# Patient Record
Sex: Female | Born: 1985 | Hispanic: Yes | Marital: Married | Smoking: Never smoker
Health system: Southern US, Community
[De-identification: ages and names within clinical notes are randomized; demographics above are authoritative.]

## PROBLEM LIST (undated history)

## (undated) DIAGNOSIS — K8020 Calculus of gallbladder without cholecystitis without obstruction: Secondary | ICD-10-CM

## (undated) HISTORY — PX: APPENDECTOMY: SHX54

---

## 2016-04-26 ENCOUNTER — Emergency Department
Admission: EM | Admit: 2016-04-26 | Discharge: 2016-04-26 | Disposition: A | Discharge status: Home / Self Care | Payer: Self-pay | Attending: Emergency Medicine | Admitting: Emergency Medicine

## 2016-04-26 DIAGNOSIS — B349 Viral infection, unspecified: Secondary | ICD-10-CM

## 2016-04-26 DIAGNOSIS — R0981 Nasal congestion: Secondary | ICD-10-CM

## 2016-04-26 DIAGNOSIS — R1010 Upper abdominal pain, unspecified: Secondary | ICD-10-CM | POA: Insufficient documentation

## 2016-04-26 LAB — URINALYSIS COMPLETE WITH MICROSCOPIC (ARMC ONLY)
BILIRUBIN URINE: NEGATIVE
Bacteria, UA: NONE SEEN
Glucose, UA: NEGATIVE mg/dL
KETONES UR: NEGATIVE mg/dL
LEUKOCYTES UA: NEGATIVE
Nitrite: NEGATIVE
PH: 6 (ref 5.0–8.0)
Protein, ur: NEGATIVE mg/dL
SPECIFIC GRAVITY, URINE: 1.004 — AB (ref 1.005–1.030)

## 2016-04-26 LAB — GLUCOSE, CAPILLARY: Glucose-Capillary: 80 mg/dL (ref 65–99)

## 2016-04-26 LAB — POCT PREGNANCY, URINE: Preg Test, Ur: NEGATIVE

## 2016-04-26 LAB — CBC
HCT: 39.1 % (ref 35.0–47.0)
Hemoglobin: 13.3 g/dL (ref 12.0–16.0)
MCH: 30.7 pg (ref 26.0–34.0)
MCHC: 34.1 g/dL (ref 32.0–36.0)
MCV: 89.9 fL (ref 80.0–100.0)
PLATELETS: 176 10*3/uL (ref 150–440)
RBC: 4.35 MIL/uL (ref 3.80–5.20)
RDW: 13.8 % (ref 11.5–14.5)
WBC: 7.5 10*3/uL (ref 3.6–11.0)

## 2016-04-26 LAB — BASIC METABOLIC PANEL
Anion gap: 6 (ref 5–15)
BUN: 12 mg/dL (ref 6–20)
CALCIUM: 9.2 mg/dL (ref 8.9–10.3)
CHLORIDE: 105 mmol/L (ref 101–111)
CO2: 28 mmol/L (ref 22–32)
CREATININE: 0.72 mg/dL (ref 0.44–1.00)
GFR calc non Af Amer: >60 mL/min (ref >60)
Glucose, Bld: 106 mg/dL — ABNORMAL HIGH (ref 65–99)
Potassium: 3.9 mmol/L (ref 3.5–5.1)
SODIUM: 139 mmol/L (ref 135–145)

## 2016-04-26 MED ORDER — FEXOFENADINE-PSEUDOEPHED ER 60-120 MG PO TB12: 1.0000 | ORAL_TABLET | Freq: Two times a day (BID) | ORAL | Status: DC

## 2016-04-26 MED ORDER — KETOROLAC TROMETHAMINE 60 MG/2ML IM SOLN
60.0000 mg | Freq: Once | INTRAMUSCULAR | Status: AC
  Administered 2016-04-26: 60 mg via INTRAMUSCULAR

## 2016-04-26 MED ORDER — NAPROXEN 500 MG PO TABS: 500.0000 mg | ORAL_TABLET | Freq: Two times a day (BID) | ORAL | Status: DC

## 2016-04-26 MED ORDER — KETOROLAC TROMETHAMINE 60 MG/2ML IM SOLN
INTRAMUSCULAR | Status: AC
  Filled 2016-04-26: qty 2

## 2016-04-26 NOTE — ED Notes (Signed)
Pt c/o of headache beginning last night. Pt c/o dizziness, and "body shakes" beginning this morning. Pt c/o congestion in morning. Pt denies N/V/D. Pt c/o upper medial abdominal pain. Pt reports being given medication for gastritis last week in clinic.

## 2016-04-26 NOTE — ED Notes (Signed)
Used Surveyor, mineralstranslator Rafael to assess patient

## 2016-04-26 NOTE — ED Notes (Signed)
[facility], pt reports started last night with weakness, dizziness, and a headache. Pt states she feels like she has a fever inside but has not taken her temp at home. Pt states she has also felt like she has a cold. Pt reports when she stands she feels like she is going to fall down. Pt also reports pressure across her face and forehead in her sinus area. Pt talking in full and complete sentences with no difficulty at this time.

## 2016-04-26 NOTE — Discharge Instructions (Signed)
Control de infecciones en el hogar (Infection Control in the Home) Si tiene una infeccin o est cuidando a alguien que tiene una infeccin, es importante saber cmo evitar que la infeccin se propague. Siga estas pautas para evitar la propagacin de una infeccin y hable con el mdico. CMO SE PROPAGAN LAS INFECCIONES? Para que una infeccin se propague, debe existir lo siguiente:  Un germen. Esto puede ser un virus, una bacteria, un hongo o un parsito.  Un lugar donde viva el germen. Esto puede ser lo siguiente:  En una persona, animal, planta o alimento.  En el suelo o en el agua.  En superficies como la manija de [place].  Un anfitrin vulnerable. Se trata de una persona o animal que no tiene resistencia (inmunidad) al germen.  Una manera de que el germen ingrese al anfitrin. Esto puede ocurrir de las siguientes maneras:  Chemical engineer. Esto ocurre al hacer contacto (por ejemplo, dar la mano o abrazar) con Neomia Dear persona o animal infectados. Algunos grmenes tambin pueden viajar a travs del aire y contagiarlo si una persona infectada tose o estornuda sobre usted o cerca de usted.  Contacto indirecto. Esto es cuando el germen ingresa en el anfitrin a travs del contacto con un objeto infectado. Los ejemplos incluyen comer alimentos contaminados, beber agua contaminada o tocar una superficie contaminada con las manos e inmediatamente despus tocarse la cara, la nariz o la boca. CMO PUEDO PREVENIR LA PROPAGACIN DE LA INFECCIN? Hay varias cosas que puede hacer para prevenir la propagacin de la infeccin. Lavado de manos Es muy importante lavarse las manos correctamente, siguiendo estos pasos: 1. Mjese las manos con agua corriente limpia. 2. Aplquese jabn en las manos. El jabn lquido es mejor que el jabn en barra. 3. Lenoria Farrier las manos rpidamente para hacer espuma. 4. Hgalo durante al menos 20 segundos. Refriegue bien todas las partes de [address], incluido debajo de  las uas y [employer] dedos. 5. Enjuguese las manos con agua corriente limpia hasta que no [place]. 6. Squese las manos con un secador de aire o con una toalla limpia de papel o tela, o deje que se sequen al aire. No use su ropa o una toalla sucia para secarse. 7. Si est en un bao pblico, use su propia toalla para cerrar el grifo y abrir la puerta del bao. Asegrese de lavarse las manos:  Antes de:  Visitar a un beb o a alguien con un sistema de defensa (inmunitario) debilitado o deprimido.  Ponerse y [employer] de [place].  Despus de:  Printmaker y jugar al [employer].  Tocar un animal, sus juguetes o su correa.  Manipular ganado.  Usar el bao o ayudar a un nio o a un adulto a Biomedical engineer.  Usar productos de limpieza del hogar o sustancias qumicas txicas.  Tocar o sacar la basura.  Tocar cualquier objeto sucio en su casa.  Manipular ropa o trapos sucios.  Cuidar a un nio enfermo. Esto incluye tocar pauelos usados, juguetes y ropa.  Estornudar, toser o Agricultural consultant.  Viajar en transporte pblico.  Dar la mano.  Usar un telfono, incluido su telfono mvil.  Tocar dinero.  Antes y despus de:  Preparar comida.  Preparar el bibern de un beb.  Alimentar a un beb o a un nio pequeo.  Comer.  Visitar o cuidar a alguien enfermo.  Cambiar un paal.  Cambiar un vendaje, o cuidar de una lesin o herida.  Administrar o  tomar medicamentos. Cuidado del hogar  Asegrese de tener siempre productos de limpieza. Estos incluyen los siguientes:  Desinfectantes.  Paos de limpieza reutilizables. Lvelos despus de cada uso.  Toallas de papel.  Guantes de [place]an[place]rabajo. Reemplace los guantes si estn rotos o rasgados o si se empiezan a gastar.  Use productos que contenga lavandina de forma segura. Nunca los Longs Drug Storesmezcle con otros productos de limpieza, en especial, si contienen amonaco. Esta mezcla puede producir un gas peligroso que puede ser  mortal.  Cuide los productos de limpieza. Las escobillas de inodoro, trapeadores y Intel Corporationesponjas pueden cultivar grmenes. Sumrjalos en agua y leja durante cinco minutos despus de cada uso.  No vierta el agua usada del trapeador en el fregadero. En lugar de eso, trela en el inodoro.  Mantenga una ventilacin Estate agentadecuada en el hogar.  Si tiene Dynegyuna mascota, asegrese de Carvermantenerla limpia. No permita que personas con un sistema inmunitario dbil toquen excremento de pjaros, el agua de la pecera ni bandejas sanitarias.  Si tiene Financial controllerun gato, asegrese de Administratorlimpiar la bandeja sanitaria todos Bellefontelos das.  En el bao, asegrese de lo siguiente:  Reponer el jabn lquido.  Cambiar las toallas y las toallitas de mano con frecuencia. Evitar compartir toallas y [address] Finaltoallitas de [place].  Cambiar los cepillos de dientes a menudo y guardarlos por separado en un lugar limpio y seco.  Desinfectar el inodoro.  Limpiar la baera, la ducha y el fregadero con productos de Art gallery managerlimpieza estndar.  Trapear el piso con un producto de limpieza estndar.  No compartir elementos personales, como afeitadoras, cepillos de dientes, vasos, desodorantes, peines, cepillos, toallas y [address]/Antonio G Mellado Finaltoallitas de [address]mano.  En la cocina, asegrese de lo siguiente:  Academic librarianAlmacenar los Constellation Brandsalimentos cuidadosamente.  Refrigerar las sobras de inmediato en recipientes con tapa.  Tirar alimentos rancios o en mal estado.  Limpiar el interior del refrigerador todas las semanas.  Mantener el refrigerador en 35F (4C) o menos y el congelador a una temperatura de 62F (-18C) o menos.  Descongelar los Quest Diagnosticsalimentos en el refrigerador o el microondas, no a Publishing rights managertemperatura ambiente.  Servir los alimentos a la Optician, dispensingtemperatura adecuada. No comer carne cruda. Asegrese de Constellation Energycocinar la carne a la temperatura adecuada. Cocinar los News Corporationhuevos hasta que estn firmes.  Lavar las frutas y las verduras debajo del agua corriente.  Usar tablas de corte, platos y utensilios distintos para  alimentos crudos y alimentos cocidos.  Mantener las superficies de trabajo limpias.  Usar una cuchara limpia cada vez que prueba la comida mientras cocina.  Lavar la vajilla con agua caliente y [place]. Secar la vajilla al aire o usar un lavavajillas.  No compartir tenedores, tazas o cucharas durante las comidas.  Usar guantes si la ropa est visiblemente sucia.  Cambiar la ropa blanca todas las semanas o cuando est sucia.  No sacudir la ropa blanca sucia. Hacerlo puede esparcir los grmenes por el aire. Colocar apsitos, toallitas femeninas o para la incontinencia, paales y guantes en bolsas de basura plsticas para su eliminacin.   Esta informacin no tiene Theme park managercomo fin reemplazar el consejo del mdico. Asegrese de hacerle al mdico cualquier pregunta que tenga.   Document Released: 10/23/2008 Document Revised: 12/01/2014 Elsevier Interactive Patient Education [employer]2016 Elsevier Inc.

## 2016-04-26 NOTE — ED Notes (Signed)
Used Surveyor, mineralstranslator Rafael to discharge pt. Reviewed d/c instructions, prescriptions, and follow-up care pt. Pt verbalized understanding.

## 2016-04-26 NOTE — ED Provider Notes (Signed)
[facility] Emergency Department Provider Note   ____________________________________________  Time seen: Approximately 8:49 PM  I have reviewed the triage vital signs and the nursing notes.   HISTORY Via interpreter Chief Complaint Dizziness; Headache; Weakness; and URI    HPI Gabriela Lewis Gabriela Lewis is a 30 y.o. female  patient complain of headache, facial pressure/ pain nasal congestion and upper abdominal pain. Patient states she believes she has a fever. Patient also states she's feeling some vertigo she comes from a sitting to a standing position. Patient states she also having stomach pain prescribed being treated for gastritis with medication she had taken last week. Patient denies any nausea vomiting diarrhea at this time. Patient denies any dysuria. No palliative measures taken for this complaint. Patient rates the pain discomfort as a 7/10.   No past medical history on file.  There are no active problems to display for this patient.   No past surgical history on file.  No current outpatient prescriptions on file.  Allergies Review of patient's allergies indicates no known allergies.  No family history on file.  Social History Social History  Substance Use Topics  . Smoking status: Not on file  . Smokeless tobacco: Not on file  . Alcohol Use: Not on file    Review of Systems Constitutional:Fever and chills. Body aches Eyes: No visual changes. ENT: No sore throat. Cardiovascular: Denies chest pain. Respiratory: Denies shortness of breath. Gastrointestinal: Upper abdominal pain.  No nausea, no vomiting.  No diarrhea.  No constipation. Genitourinary: Negative for dysuria. Musculoskeletal: Negative for back pain. Skin: Negative for rash. Neurological: Negative for headaches, focal weakness or numbness.  .  ____________________________________________   PHYSICAL EXAM:  VITAL SIGNS: ED Triage Vitals  Enc Vitals Group   BP 04/26/16 2021 125/80 mmHg     Pulse Rate 04/26/16 2021 94     Resp 04/26/16 2021 18     Temp 04/26/16 2021 99.1 F (37.3 C)     Temp Source 04/26/16 2021 Oral     SpO2 04/26/16 2021 100 %     Weight 04/26/16 2021 186 lb (84.369 kg)     Height --      Head Cir --      Peak Flow --      Pain Score 04/26/16 2022 7     Pain Loc --      Pain Edu? --      Excl. in GC? --     Constitutional: Alert and oriented. Well appearing and in no acute distress. Eyes: Conjunctivae are normal. PERRL. EOMI. Head: Atraumatic. Nose: Bilateral maxillary guarding. Edematous nasal turbinates. Mouth/Throat: Mucous membranes are moist.  Oropharynx non-erythematous. Neck: No stridor.  No cervical spine tenderness to palpation. Hematological/Lymphatic/Immunilogical: No cervical lymphadenopathy. Cardiovascular: Normal rate, regular rhythm. Grossly normal heart sounds.  Good peripheral circulation. Respiratory: Normal respiratory effort.  No retractions. Lungs CTAB. Gastrointestinal: Soft and nontender. No distention. No abdominal bruits. No CVA tenderness. Musculoskeletal: No lower extremity tenderness nor edema.  No joint effusions. Neurologic:  Normal speech and language. No gross focal neurologic deficits are appreciated. No gait instability. Skin:  Skin is warm, dry and intact. No rash noted. Psychiatric: Mood and affect are normal. Speech and behavior are normal.  ____________________________________________   LABS (all labs ordered are listed, but only abnormal results are displayed)  Labs Reviewed  BASIC METABOLIC PANEL - Abnormal; Notable for the following:    Glucose, Bld 106 (*)    All other components within  normal limits  URINALYSIS COMPLETEWITH MICROSCOPIC ([facility] ONLY) - Abnormal; Notable for the following:    Color, Urine STRAW (*)    APPearance CLEAR (*)    Specific Gravity, Urine 1.004 (*)    Hgb urine dipstick 3+ (*)    Squamous Epithelial / LPF 0-5 (*)    All other components  within normal limits  CBC  GLUCOSE, CAPILLARY  CBG MONITORING, ED  POCT PREGNANCY, URINE  POC URINE PREG, ED   ____________________________________________  EKG  EKG read by heart station doctor.  ____________________________________________  RADIOLOGY   ____________________________________________   PROCEDURES  Procedure(s) performed: None  Critical Care performed: No  ____________________________________________   INITIAL IMPRESSION / ASSESSMENT AND PLAN / ED COURSE  Pertinent labs & imaging results that were available during my care of the patient were reviewed by me and considered in my medical decision making (see chart for details).  Sinus congestion and viral illness. Patient given discharge Instructions. Patient given a prescription for Allegra-D and naproxen. [facility] ____________________________________________   FINAL CLINICAL IMPRESSION(S) / ED DIAGNOSES  Final diagnoses:  Viral illness  Congestion of nasal sinus      NEW MEDICATIONS STARTED DURING THIS VISIT:  New Prescriptions   No medications on file     Note:  This document was prepared using Dragon voice recognition software and may include unintentional dictation errors.    Joni Reining, PA-C 04/26/16 2124  Jennye Moccasin, MD 04/27/16 1501  Jennye Moccasin, MD 05/09/16 [phone]

## 2016-09-04 ENCOUNTER — Emergency Department
Admission: EM | Admit: 2016-09-04 | Discharge: 2016-09-04 | Disposition: A | Discharge status: Home / Self Care | Payer: BLUE CROSS/BLUE SHIELD | Attending: Emergency Medicine | Admitting: Emergency Medicine

## 2016-09-04 ENCOUNTER — Encounter: Payer: Self-pay | Admitting: Emergency Medicine

## 2016-09-04 DIAGNOSIS — M79674 Pain in right toe(s): Secondary | ICD-10-CM | POA: Diagnosis present

## 2016-09-04 DIAGNOSIS — L03031 Cellulitis of right toe: Secondary | ICD-10-CM

## 2016-09-04 MED ORDER — NAPROXEN 500 MG PO TABS: 500.0000 mg | ORAL_TABLET | Freq: Two times a day (BID) | ORAL | 0 refills | Status: AC

## 2016-09-04 MED ORDER — HYDROCODONE-ACETAMINOPHEN 5-325 MG PO TABS: 1.0000 | ORAL_TABLET | ORAL | 0 refills | Status: AC | PRN

## 2016-09-04 NOTE — ED Provider Notes (Signed)
[facility] Emergency Department Provider Note   ____________________________________________   None    (approximate)  I have reviewed the triage vital signs and the nursing notes.   HISTORY  Chief Complaint Toe Pain    HPI Gabriela Lewis is a 30 y.o. female presents for evaluation of her great toe pain. Patient states been hurting for the last 3 days. Complains of pain to the lateral aspect of the toenail. Denies any trauma.   History reviewed. No pertinent past medical history.  There are no active problems to display for this patient.   History reviewed. No pertinent surgical history.  Prior to Admission medications   Medication Sig Start Date End Date Taking? Authorizing Provider  HYDROcodone-acetaminophen (NORCO) 5-325 MG tablet Take 1-2 tablets by mouth every 4 (four) hours as needed for moderate pain. 09/04/16   Evangeline Dakinharles M Jayce Boyko, PA-C  naproxen (NAPROSYN) 500 MG tablet Take 1 tablet (500 mg total) by mouth 2 (two) times daily with a meal. 09/04/16   Evangeline Dakinharles M Caisen Mangas, PA-C    Allergies Review of patient's allergies indicates no known allergies.  No family history on file.  Social History Social History  Substance Use Topics  . Smoking status: Never Smoker  . Smokeless tobacco: Never Used  . Alcohol use Not on file    Review of Systems Constitutional: No fever/chills Cardiovascular: Denies chest pain. Respiratory: Denies shortness of breath. Musculoskeletal: Positive for right toe pain. Skin: Negative for rash. Neurological: Negative for headaches, focal weakness or numbness.  10-point ROS otherwise negative.  ____________________________________________   PHYSICAL EXAM:  VITAL SIGNS: ED Triage Vitals  Enc Vitals Group     BP 09/04/16 1624 129/84     Pulse Rate 09/04/16 1624 80     Resp 09/04/16 1624 16     Temp 09/04/16 1624 98.6 F (37 C)     Temp Source 09/04/16 1624 Oral     SpO2 09/04/16 1624 100 %     Weight 09/04/16 1624 179 lb (81.2 kg)     Height 09/04/16 1624 5\' 3"  (1.6 m)     Head Circumference --      Peak Flow --      Pain Score 09/04/16 1621 6     Pain Loc --      Pain Edu? --      Excl. in GC? --     Constitutional: Alert and oriented. Well appearing and in no acute distress. Musculoskeletal: Right great toe with mild edema and obvious ingrown toenail noted to the lateral aspect of the toe. No pustular drainage noted. Neurologic:  Normal speech and language. No gross focal neurologic deficits are appreciated. No gait instability. Skin:  Skin is warm, dry and intact. No rash noted. Psychiatric: Mood and affect are normal. Speech and behavior are normal.  ____________________________________________   LABS (all labs ordered are listed, but only abnormal results are displayed)  Labs Reviewed - No data to display ____________________________________________  EKG   ____________________________________________  RADIOLOGY  Deferred at this visit. ____________________________________________   PROCEDURES  Procedure(s) performed: None  Procedures  Critical Care performed: No  ____________________________________________   INITIAL IMPRESSION / ASSESSMENT AND PLAN / ED COURSE  Pertinent labs & imaging results that were available during my care of the patient were reviewed by me and considered in my medical decision making (see chart for details).  Right great toenail paronychia. Reassurance provided to the patient encouraged antibiotics Bactrim DS twice a day for 10 days  and Vicodin every 6 hours as needed for pain. She is to follow-up with podiatry on call or return to the ER.  Clinical Course     ____________________________________________   FINAL CLINICAL IMPRESSION(S) / ED DIAGNOSES  Final diagnoses:  Paronychia of great toe, right      NEW MEDICATIONS STARTED DURING THIS VISIT:  New Prescriptions   HYDROCODONE-ACETAMINOPHEN (NORCO) 5-325  MG TABLET    Take 1-2 tablets by mouth every 4 (four) hours as needed for moderate pain.   NAPROXEN (NAPROSYN) 500 MG TABLET    Take 1 tablet (500 mg total) by mouth 2 (two) times daily with a meal.     Note:  This document was prepared using Dragon voice recognition software and may include unintentional dictation errors.   Evangeline Dakin, PA-C 09/04/16 1700    Jeanmarie Plant, MD 09/04/16 2031

## 2016-09-04 NOTE — ED Notes (Signed)
Post op shoe applied left foot

## 2016-09-04 NOTE — ED Triage Notes (Signed)
Pain in left great toenail.  Ambulates well

## 2017-03-18 DIAGNOSIS — N879 Dysplasia of cervix uteri, unspecified: Secondary | ICD-10-CM | POA: Insufficient documentation

## 2017-11-20 ENCOUNTER — Emergency Department: Payer: BLUE CROSS/BLUE SHIELD

## 2017-11-20 ENCOUNTER — Other Ambulatory Visit: Payer: Self-pay

## 2017-11-20 ENCOUNTER — Emergency Department
Admission: EM | Admit: 2017-11-20 | Discharge: 2017-11-20 | Disposition: A | Discharge status: Home / Self Care | Payer: BLUE CROSS/BLUE SHIELD | Attending: Emergency Medicine | Admitting: Emergency Medicine

## 2017-11-20 ENCOUNTER — Encounter: Payer: Self-pay | Admitting: Emergency Medicine

## 2017-11-20 DIAGNOSIS — O209 Hemorrhage in early pregnancy, unspecified: Secondary | ICD-10-CM | POA: Diagnosis present

## 2017-11-20 DIAGNOSIS — Z3A11 11 weeks gestation of pregnancy: Secondary | ICD-10-CM | POA: Insufficient documentation

## 2017-11-20 DIAGNOSIS — O200 Threatened abortion: Secondary | ICD-10-CM | POA: Diagnosis not present

## 2017-11-20 LAB — URINALYSIS, COMPLETE (UACMP) WITH MICROSCOPIC
Bacteria, UA: NONE SEEN
Bilirubin Urine: NEGATIVE
Glucose, UA: NEGATIVE mg/dL
Hgb urine dipstick: NEGATIVE
Ketones, ur: NEGATIVE mg/dL
Leukocytes, UA: NEGATIVE
Nitrite: NEGATIVE
Protein, ur: NEGATIVE mg/dL
Specific Gravity, Urine: 1.013 (ref 1.005–1.030)
pH: 7 (ref 5.0–8.0)

## 2017-11-20 LAB — CBC WITH DIFFERENTIAL/PLATELET
BASOS ABS: 0 10*3/uL (ref 0–0.1)
Basophils Relative: 0 %
EOS PCT: 0 %
Eosinophils Absolute: 0 10*3/uL (ref 0–0.7)
HCT: 38.7 % (ref 35.0–47.0)
Hemoglobin: 13.1 g/dL (ref 12.0–16.0)
LYMPHS PCT: 30 %
Lymphs Abs: 2 10*3/uL (ref 1.0–3.6)
MCH: 31.3 pg (ref 26.0–34.0)
MCHC: 33.9 g/dL (ref 32.0–36.0)
MCV: 92.4 fL (ref 80.0–100.0)
MONO ABS: 0.5 10*3/uL (ref 0.2–0.9)
Monocytes Relative: 8 %
Neutro Abs: 4.1 10*3/uL (ref 1.4–6.5)
Neutrophils Relative %: 62 %
PLATELETS: 155 10*3/uL (ref 150–440)
RBC: 4.19 MIL/uL (ref 3.80–5.20)
RDW: 13 % (ref 11.5–14.5)
WBC: 6.7 10*3/uL (ref 3.6–11.0)

## 2017-11-20 LAB — COMPREHENSIVE METABOLIC PANEL
ALT: 23 U/L (ref 14–54)
AST: 24 U/L (ref 15–41)
Albumin: 3.7 g/dL (ref 3.5–5.0)
Alkaline Phosphatase: 46 U/L (ref 38–126)
Anion gap: 7 (ref 5–15)
BUN: 7 mg/dL (ref 6–20)
CHLORIDE: 103 mmol/L (ref 101–111)
CO2: 24 mmol/L (ref 22–32)
CREATININE: 0.45 mg/dL (ref 0.44–1.00)
Calcium: 9.1 mg/dL (ref 8.9–10.3)
GFR calc Af Amer: >60 mL/min (ref >60)
Glucose, Bld: 97 mg/dL (ref 65–99)
POTASSIUM: 4 mmol/L (ref 3.5–5.1)
SODIUM: 134 mmol/L — AB (ref 135–145)
Total Bilirubin: 0.5 mg/dL (ref 0.3–1.2)
Total Protein: 6.7 g/dL (ref 6.5–8.1)

## 2017-11-20 LAB — ABO/RH: ABO/RH(D): B POS

## 2017-11-20 LAB — POCT PREGNANCY, URINE: Preg Test, Ur: POSITIVE — AB

## 2017-11-20 LAB — HCG, QUANTITATIVE, PREGNANCY: hCG, Beta Chain, Quant, S: 134177 m[IU]/mL — ABNORMAL HIGH (ref <5)

## 2017-11-20 NOTE — ED Notes (Signed)
Patient had positive pregnancy tests at home and health department. Concerned about some bleeding after having intercourse

## 2017-11-20 NOTE — ED Triage Notes (Signed)
Pt to ED via POV c/o vaginal bleeding x 1 day. Pt states that on December 14 th she was bleeding also, the bleeding lasted x 3 days and then stopped. Pt states that her LMP was 09/07/17. Pt denies abdominal cramps. Pt states that she has had 2 positive pregnancy test at home and also seen at the health department and had a positive test. Pt also reports seeing blood after having intercourse.

## 2017-11-20 NOTE — ED Notes (Signed)
Waiting on interpreter for discharge.

## 2017-11-20 NOTE — ED Provider Notes (Signed)
[facility] Emergency Department Provider Note       Time seen: ----------------------------------------- 6:24 PM on 11/20/2017 -----------------------------------------   I have reviewed the triage vital signs and the nursing notes.  HISTORY   Chief Complaint Vaginal Bleeding    HPI Gabriela Lewis is a 31 y.o. female without significant past medical history who presents to the ED for vaginal bleeding for the last day.  Patient states she is having a small amount of bleeding when she wipes.  Patient states on December 14 she was bleeding also and then the bleeding stopped.  She think she is around [redacted] weeks pregnant, this is her third pregnancy.  2 prior pregnancies were unremarkable.  She denies having any abdominal pain.  History reviewed. No pertinent past medical history.  There are no active problems to display for this patient.   History reviewed. No pertinent surgical history.  Allergies Patient has no known allergies.  Social History Social History   Tobacco Use  . Smoking status: Never Smoker  . Smokeless tobacco: Never Used  Substance Use Topics  . Alcohol use: No    Frequency: Never  . Drug use: No    Review of Systems Constitutional: Negative for fever. Cardiovascular: Negative for chest pain. Respiratory: Negative for shortness of breath. Gastrointestinal: Negative for abdominal pain, vomiting Genitourinary: Negative for dysuria. positive for vaginal bleeding Musculoskeletal: Negative for back pain. Skin: Negative for rash. Neurological: Negative for headaches, focal weakness or numbness.  All systems negative/normal/unremarkable except as stated in the HPI  ____________________________________________   PHYSICAL EXAM:  VITAL SIGNS: ED Triage Vitals  Enc Vitals Group     BP 11/20/17 1704 108/74     Pulse Rate 11/20/17 1704 93     Resp 11/20/17 1704 16     Temp 11/20/17 1704 98 F (36.7 C)     Temp Source  11/20/17 1704 Oral     SpO2 11/20/17 1704 100 %     Weight 11/20/17 1706 179 lb (81.2 kg)     Height --      Head Circumference --      Peak Flow --      Pain Score --      Pain Loc --      Pain Edu? --      Excl. in GC? --     Constitutional: Alert and oriented. Well appearing and in no distress. Eyes: Conjunctivae are normal. Normal extraocular movements. ENT   Head: Normocephalic and atraumatic.   Nose: No congestion/rhinnorhea.   Mouth/Throat: Mucous membranes are moist.   Neck: No stridor. Cardiovascular: Normal rate, regular rhythm. No murmurs, rubs, or gallops. Respiratory: Normal respiratory effort without tachypnea nor retractions. Breath sounds are clear and equal bilaterally. No wheezes/rales/rhonchi. Gastrointestinal: Soft and nontender. Normal bowel sounds Musculoskeletal: Nontender with normal range of motion in extremities. No lower extremity tenderness nor edema. Neurologic:  Normal speech and language. No gross focal neurologic deficits are appreciated.  Skin:  Skin is warm, dry and intact. No rash noted. Psychiatric: Mood and affect are normal. Speech and behavior are normal.  ____________________________________________  ED COURSE:  As part of my medical decision making, I reviewed the following data within the electronic MEDICAL RECORD NUMBER History obtained from family if available, nursing notes, old chart and ekg, as well as notes from prior ED visits. Patient presented for vaginal bleeding and pregnancy, we will assess with labs and imaging as indicated at this time.   Procedures ____________________________________________  LABS (pertinent positives/negatives)  Labs Reviewed  URINALYSIS, COMPLETE (UACMP) WITH MICROSCOPIC - Abnormal; Notable for the following components:      Result Value   Color, Urine YELLOW (*)    APPearance HAZY (*)    Squamous Epithelial / LPF 0-5 (*)    All other components within normal limits  COMPREHENSIVE  METABOLIC PANEL - Abnormal; Notable for the following components:   Sodium 134 (*)    All other components within normal limits  HCG, QUANTITATIVE, PREGNANCY - Abnormal; Notable for the following components:   hCG, Beta Chain, Quant, S 134,177 (*)    All other components within normal limits  POCT PREGNANCY, URINE - Abnormal; Notable for the following components:   Preg Test, Ur POSITIVE (*)    All other components within normal limits  CBC WITH DIFFERENTIAL/PLATELET  POC URINE PREG, ED  ABO/RH    RADIOLOGY  Pregnancy ultrasound  IMPRESSION: Single live intrauterine pregnancy with an estimated gestational age of [redacted] weeks, 0 days. ____________________________________________  DIFFERENTIAL DIAGNOSIS   Normal pregnancy, ectopic, threatened miscarriage, postcoital bleeding  FINAL ASSESSMENT AND PLAN  Threatened miscarriage   Plan: Patient had presented for vaginal bleeding in pregnancy. Patient's labs are reassuring. Patient's imaging reveals 11-week pregnancy with no other acute findings.  There are stable for outpatient GYN follow-up.   Emily FilbertWilliams, Tacha Manni E, MD   Note: This note was generated in part or whole with voice recognition software. Voice recognition is usually quite accurate but there are transcription errors that can and very often do occur. I apologize for any typographical errors that were not detected and corrected.     Emily FilbertWilliams, Kalief Kattner E, MD 11/20/17 2017

## 2017-12-02 ENCOUNTER — Emergency Department
Admission: EM | Admit: 2017-12-02 | Discharge: 2017-12-03 | Disposition: A | Discharge status: Home / Self Care | Payer: BLUE CROSS/BLUE SHIELD | Attending: Emergency Medicine | Admitting: Emergency Medicine

## 2017-12-02 ENCOUNTER — Encounter: Payer: Self-pay | Admitting: Emergency Medicine

## 2017-12-02 ENCOUNTER — Emergency Department: Payer: BLUE CROSS/BLUE SHIELD

## 2017-12-02 DIAGNOSIS — Z791 Long term (current) use of non-steroidal anti-inflammatories (NSAID): Secondary | ICD-10-CM | POA: Diagnosis not present

## 2017-12-02 DIAGNOSIS — K8020 Calculus of gallbladder without cholecystitis without obstruction: Secondary | ICD-10-CM | POA: Diagnosis not present

## 2017-12-02 DIAGNOSIS — Z3A00 Weeks of gestation of pregnancy not specified: Secondary | ICD-10-CM | POA: Insufficient documentation

## 2017-12-02 DIAGNOSIS — O9989 Other specified diseases and conditions complicating pregnancy, childbirth and the puerperium: Secondary | ICD-10-CM | POA: Diagnosis not present

## 2017-12-02 DIAGNOSIS — O99611 Diseases of the digestive system complicating pregnancy, first trimester: Secondary | ICD-10-CM | POA: Diagnosis not present

## 2017-12-02 DIAGNOSIS — R1011 Right upper quadrant pain: Secondary | ICD-10-CM | POA: Diagnosis not present

## 2017-12-02 LAB — URINALYSIS, COMPLETE (UACMP) WITH MICROSCOPIC
BILIRUBIN URINE: NEGATIVE
GLUCOSE, UA: NEGATIVE mg/dL
Hgb urine dipstick: NEGATIVE
KETONES UR: NEGATIVE mg/dL
Nitrite: NEGATIVE
PH: 7 (ref 5.0–8.0)
PROTEIN: NEGATIVE mg/dL
Specific Gravity, Urine: 1.011 (ref 1.005–1.030)

## 2017-12-02 LAB — COMPREHENSIVE METABOLIC PANEL
ALT: 46 U/L (ref 14–54)
ANION GAP: 9 (ref 5–15)
AST: 35 U/L (ref 15–41)
Albumin: 3.7 g/dL (ref 3.5–5.0)
Alkaline Phosphatase: 45 U/L (ref 38–126)
BUN: 7 mg/dL (ref 6–20)
CO2: 26 mmol/L (ref 22–32)
CREATININE: 0.43 mg/dL — AB (ref 0.44–1.00)
Calcium: 9.4 mg/dL (ref 8.9–10.3)
Chloride: 103 mmol/L (ref 101–111)
Glucose, Bld: 93 mg/dL (ref 65–99)
POTASSIUM: 3.3 mmol/L — AB (ref 3.5–5.1)
SODIUM: 138 mmol/L (ref 135–145)
Total Bilirubin: 0.8 mg/dL (ref 0.3–1.2)
Total Protein: 7.2 g/dL (ref 6.5–8.1)

## 2017-12-02 LAB — CBC
HCT: 39.2 % (ref 35.0–47.0)
HEMOGLOBIN: 13.3 g/dL (ref 12.0–16.0)
MCH: 31.3 pg (ref 26.0–34.0)
MCHC: 33.9 g/dL (ref 32.0–36.0)
MCV: 92.4 fL (ref 80.0–100.0)
PLATELETS: 163 10*3/uL (ref 150–440)
RBC: 4.24 MIL/uL (ref 3.80–5.20)
RDW: 13.2 % (ref 11.5–14.5)
WBC: 7.5 10*3/uL (ref 3.6–11.0)

## 2017-12-02 LAB — HCG, QUANTITATIVE, PREGNANCY: hCG, Beta Chain, Quant, S: 119306 m[IU]/mL — ABNORMAL HIGH (ref <5)

## 2017-12-02 LAB — TROPONIN I

## 2017-12-02 LAB — LIPASE, BLOOD: LIPASE: 40 U/L (ref 11–51)

## 2017-12-02 MED ORDER — SODIUM CHLORIDE 0.9 % IV BOLUS (SEPSIS)
1000.0000 mL | Freq: Once | INTRAVENOUS | Status: AC
  Administered 2017-12-02: 1000 mL via INTRAVENOUS

## 2017-12-02 MED ORDER — MORPHINE SULFATE (PF) 2 MG/ML IV SOLN
2.0000 mg | Freq: Once | INTRAVENOUS | Status: AC
  Administered 2017-12-02: 2 mg via INTRAVENOUS
  Filled 2017-12-02: qty 1

## 2017-12-02 MED ORDER — MORPHINE SULFATE (PF) 2 MG/ML IV SOLN
INTRAVENOUS | Status: AC
  Filled 2017-12-02: qty 1

## 2017-12-02 MED ORDER — ONDANSETRON HCL 4 MG/2ML IJ SOLN
4.0000 mg | Freq: Once | INTRAMUSCULAR | Status: AC
  Administered 2017-12-02: 4 mg via INTRAVENOUS
  Filled 2017-12-02: qty 2

## 2017-12-02 NOTE — ED Provider Notes (Signed)
[facility] Emergency Department Provider Note __   First MD Initiated Contact with Patient 12/02/17 2312     (approximate)  I have reviewed the triage vital signs and the nursing notes.   HISTORY  Chief Complaint Abdominal Pain   HPI Gabriela Lewis is a 32 y.o. female G4 P3 presents emergency department with 8 out of 10 right upper quadrant abdominal pain which is been occurring for the past 4 days accompanied by nausea and nonbloody vomiting.   Past medical history G4 P3 There are no active problems to display for this patient.   Past surgical history None  Prior to Admission medications   Medication Sig Start Date End Date Taking? Authorizing Provider  HYDROcodone-acetaminophen (NORCO) 5-325 MG tablet Take 1-2 tablets by mouth every 4 (four) hours as needed for moderate pain. 09/04/16   Beers, Charmayne Sheerharles M, PA-C  naproxen (NAPROSYN) 500 MG tablet Take 1 tablet (500 mg total) by mouth 2 (two) times daily with a meal. 09/04/16   Beers, Charmayne Sheerharles M, PA-C  ondansetron (ZOFRAN ODT) 4 MG disintegrating tablet Take 1 tablet (4 mg total) by mouth every 8 (eight) hours as needed for nausea or vomiting. 12/03/17   Darci CurrentBrown, [place] N, MD    Allergies No known drug allergies  History reviewed. No pertinent family history.  Social History Social History   Tobacco Use  . Smoking status: Never Smoker  . Smokeless tobacco: Never Used  Substance Use Topics  . Alcohol use: No    Frequency: Never  . Drug use: No    Review of Systems Constitutional: No fever/chills Eyes: No visual changes. ENT: No sore throat. Cardiovascular: Denies chest pain. Respiratory: Denies shortness of breath. Gastrointestinal: No abdominal pain.  No nausea, no vomiting.  No diarrhea.  No constipation. Genitourinary: Negative for dysuria. Musculoskeletal: Negative for neck pain.  Negative for back pain. Integumentary: Negative for rash. Neurological: Negative for  headaches, focal weakness or numbness.   ____________________________________________   PHYSICAL EXAM:  VITAL SIGNS: ED Triage Vitals  Enc Vitals Group     BP 12/02/17 2048 132/83     Pulse Rate 12/02/17 2048 79     Resp 12/02/17 2048 16     Temp 12/02/17 2048 98 F (36.7 C)     Temp Source 12/02/17 2048 Oral     SpO2 12/02/17 2048 100 %     Weight 12/02/17 2049 81.6 kg (180 lb)     Height --      Head Circumference --      Peak Flow --      Pain Score 12/02/17 2309 6     Pain Loc --      Pain Edu? --      Excl. in GC? --     Constitutional: Alert and oriented. Well appearing and in no acute distress. Eyes: Conjunctivae are normal.  Head: Atraumatic. Ears:  Healthy appearing ear canals and TMs bilaterally Nose: No congestion/rhinnorhea. Mouth/Throat: Mucous membranes are moist.  Oropharynx non-erythematous. Neck: No stridor.  Cardiovascular: Normal rate, regular rhythm. Good peripheral circulation. Grossly normal heart sounds. Respiratory: Normal respiratory effort.  No retractions. Lungs CTAB. Gastrointestinal: Right upper quadrant tenderness to palpation. No distention.  Musculoskeletal: No lower extremity tenderness nor edema. No gross deformities of extremities. Neurologic:  Normal speech and language. No gross focal neurologic deficits are appreciated.  Skin:  Skin is warm, dry and intact. No rash noted. Psychiatric: Mood and affect are normal. Speech and behavior are normal.  ____________________________________________   LABS (all labs ordered are listed, but only abnormal results are displayed)  Labs Reviewed  COMPREHENSIVE METABOLIC PANEL - Abnormal; Notable for the following components:      Result Value   Potassium 3.3 (*)    Creatinine, Ser 0.43 (*)    All other components within normal limits  URINALYSIS, COMPLETE (UACMP) WITH MICROSCOPIC - Abnormal; Notable for the following components:   Color, Urine YELLOW (*)    APPearance CLOUDY (*)     Leukocytes, UA TRACE (*)    Bacteria, UA RARE (*)    Squamous Epithelial / LPF 0-5 (*)    All other components within normal limits  HCG, QUANTITATIVE, PREGNANCY - Abnormal; Notable for the following components:   hCG, Beta Chain, Quant, S 119,306 (*)    All other components within normal limits  LIPASE, BLOOD  CBC  TROPONIN I   ____________________________________________  EKG  ED ECG REPORT I, Gaylord N Lezly Rumpf, the attending physician, personally viewed and interpreted this ECG.   Date: 12/03/2017  EKG Time: 9:04 PM  Rate: 77  Rhythm: Normal sinus rhythm  Axis: Normal  Intervals: Normal  ST&T Change: None  ____________________________________________  RADIOLOGY I, [place] N Saylee Sherrill, personally viewed and evaluated these images (plain radiographs) as part of my medical decision making, as well as reviewing the written report by the radiologist.  US Abdomen Limited Ruq  Result Date: 12/03/2017 CLINICAL DATA:  Right upper quadrant and epigastric pain for 4 days. First trimester pregnancy. EXAM: ULTRASOUND ABDOMEN LIMITED RIGHT UPPER QUADRANT COMPARISON:  None. FINDINGS: Gallbladder: There is a 2.7 cm calculus within the gallbladder neck. Mildly abnormal thickening of the gallbladder wall measuring 3.4 mm. This sonographic Murphy's sign is unreliable as patient was medicated. Common bile duct: Diameter: 2.1 cm Liver: No focal lesion identified. Within normal limits in parenchymal echogenicity. Portal vein is patent on color Doppler imaging with normal direction of blood flow towards the liver. IMPRESSION: 2.7 cm calculus within the gallbladder neck with associated borderline thickening of the gallbladder wall. No pericholecystic fluid. In the correct clinical setting, these findings are suggestive of acute cholecystitis. Electronically Signed   By: Ted Mcalpine M.D.   On: 12/03/2017 00:43     ____________________________________________    Procedures   ____________________________________________   INITIAL IMPRESSION / ASSESSMENT AND PLAN / ED COURSE  As part of my medical decision making, I reviewed the following data within the electronic MEDICAL RECORD [id number] year old female presented with above-stated history and physical exam concerning for possible cholelithiasis which was confirmed on ultrasound.  Patient received Zofran and vomiting as well as pain.  Patient able to tolerate p.o. before discharge in the emergency department.  In addition patient received 1 L IV normal saline. ____________________________________________  FINAL CLINICAL IMPRESSION(S) / ED DIAGNOSES  Final diagnoses:  RUQ pain  Calculus of gallbladder without cholecystitis without obstruction     MEDICATIONS GIVEN DURING THIS VISIT:  Medications  morphine 2 MG/ML injection (not administered)  sodium chloride 0.9 % bolus 1,000 mL (1,000 mLs Intravenous New Bag/Given 12/02/17 2338)  ondansetron (ZOFRAN) injection 4 mg (4 mg Intravenous Given 12/02/17 2338)  morphine 2 MG/ML injection 2 mg (2 mg Intravenous Given 12/02/17 2345)     ED Discharge Orders        Ordered    ondansetron (ZOFRAN ODT) 4 MG disintegrating tablet  Every 8 hours PRN     12/03/17 0205       Note:  This document was prepared using Dragon  voice recognition software and may include unintentional dictation errors.    Darci Current, MD 12/03/17 [phone]

## 2017-12-02 NOTE — ED Notes (Signed)
Awaiting lab results and room for MD eval at this time.

## 2017-12-02 NOTE — ED Triage Notes (Signed)
Pt c/o upper abdominal pain x4 days. Pt reports she is 3 months pregnant and called her OB and they referred her to come to ED to have evaluation of symptoms.

## 2017-12-02 NOTE — ED Notes (Addendum)
Pt uprite on stretcher in exam room with no distress noted; st G3P2, [facility] pt accom[facility] by SO; st x 4 days having mid upper abd pain radiating into back accomp by N/V but no other accomp symptoms; denies hx of same; abd soft, nondist, tender to mid upper abd and increased to right upper abd

## 2017-12-02 NOTE — ED Notes (Signed)
[facility] interpreter at bedside to ensure pt has good understanding of plan of care; pt & SO voices good understanding of all explained prior to his arrival

## 2017-12-03 DIAGNOSIS — O9989 Other specified diseases and conditions complicating pregnancy, childbirth and the puerperium: Secondary | ICD-10-CM | POA: Diagnosis not present

## 2017-12-03 MED ORDER — SODIUM CHLORIDE 0.9 % IV BOLUS (SEPSIS)
1000.0000 mL | Freq: Once | INTRAVENOUS | Status: AC
  Administered 2017-12-03: 1000 mL via INTRAVENOUS

## 2017-12-03 MED ORDER — ONDANSETRON 4 MG PO TBDP: 4.0000 mg | ORAL_TABLET | Freq: Three times a day (TID) | ORAL | 0 refills | Status: AC | PRN

## 2017-12-03 NOTE — ED Notes (Addendum)
Pt to u/s via stretcher accomp by u/s tech; pt reports feeling much better now with no nausea and decreased pain 3/10

## 2017-12-03 NOTE — ED Notes (Signed)
Pt given PO trial of sprite and crackers

## 2017-12-03 NOTE — ED Notes (Signed)
Dr Manson PasseyBrown in to speak with pt regarding update on plan of care and disposition; MD using bedside tele-interpreter

## 2017-12-03 NOTE — ED Notes (Signed)
Pt tolerated all crackers and soda without difficulty or c/o

## 2018-01-12 ENCOUNTER — Other Ambulatory Visit: Payer: Self-pay | Admitting: Family Medicine

## 2018-01-12 DIAGNOSIS — Z3482 Encounter for supervision of other normal pregnancy, second trimester: Secondary | ICD-10-CM

## 2018-01-21 ENCOUNTER — Ambulatory Visit
Admission: RE | Admit: 2018-01-21 | Discharge: 2018-01-21 | Disposition: A | Discharge status: Home / Self Care | Payer: BLUE CROSS/BLUE SHIELD | Source: Ambulatory Visit | Attending: Family Medicine | Admitting: Family Medicine

## 2018-01-21 ENCOUNTER — Ambulatory Visit: Payer: BLUE CROSS/BLUE SHIELD

## 2018-01-21 DIAGNOSIS — Z3A19 19 weeks gestation of pregnancy: Secondary | ICD-10-CM | POA: Insufficient documentation

## 2018-01-21 DIAGNOSIS — Z3482 Encounter for supervision of other normal pregnancy, second trimester: Secondary | ICD-10-CM | POA: Insufficient documentation

## 2018-05-09 IMAGING — US US OB COMP LESS 14 WK
1 series · 14 of 28 positions shown · non-contrast
Comparison: None.

CLINICAL DATA: 31-year-old pregnant female with vaginal bleeding.
LMP: 09/07/2017 corresponding to an estimated gestational age of 10
weeks, 4 days.

EXAM:
OBSTETRIC <14 WK ULTRASOUND
TECHNIQUE: Transabdominal ultrasound was performed for evaluation of the
gestation as well as the maternal uterus and adnexal regions.

[Series 1: us ob comp less 14 wk · 0.22mm/px · 14 of 52 slices shown]
[im 2/52]
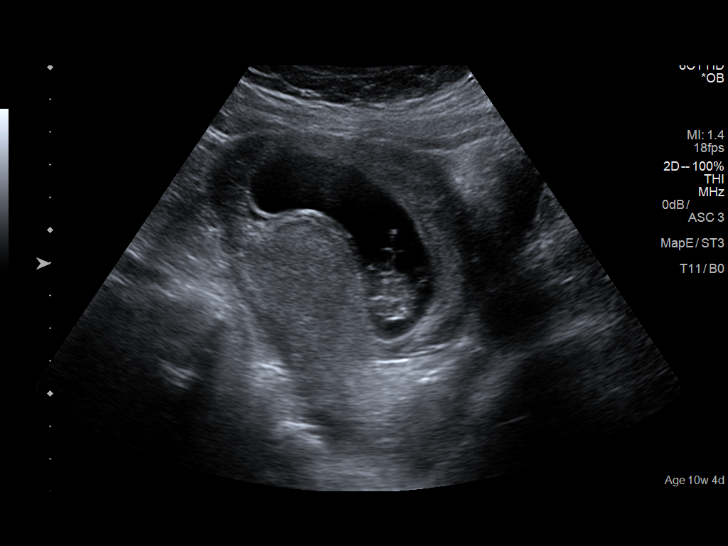
[im 6/52]
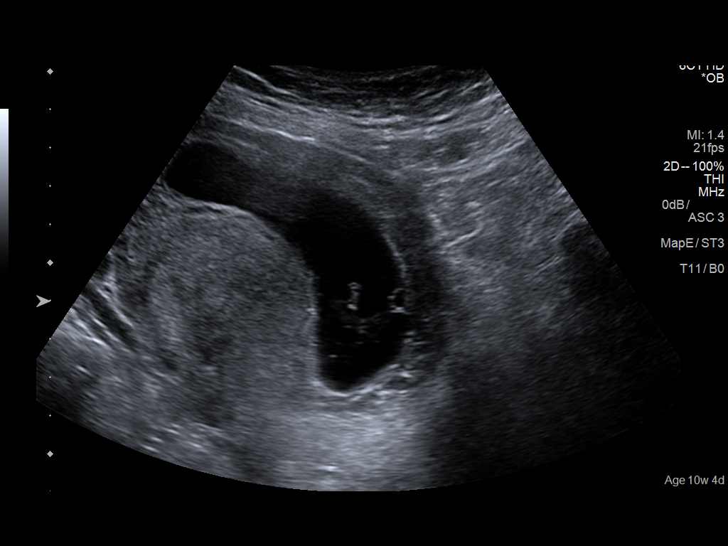
[im 10/52]
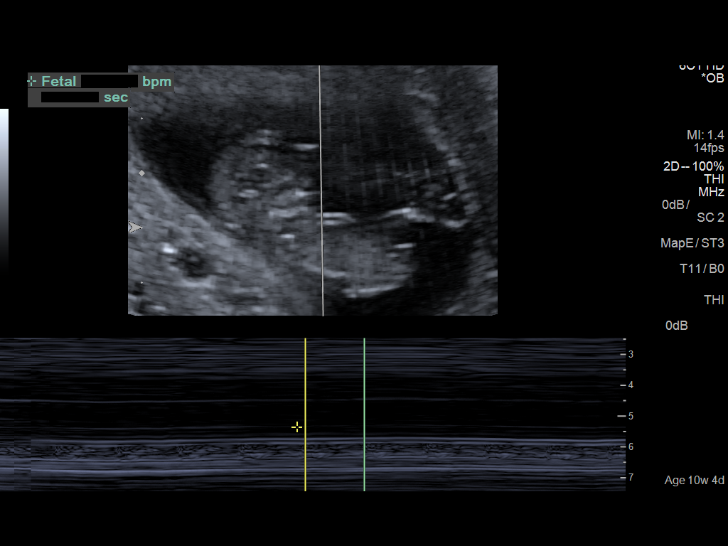
[im 14/52]
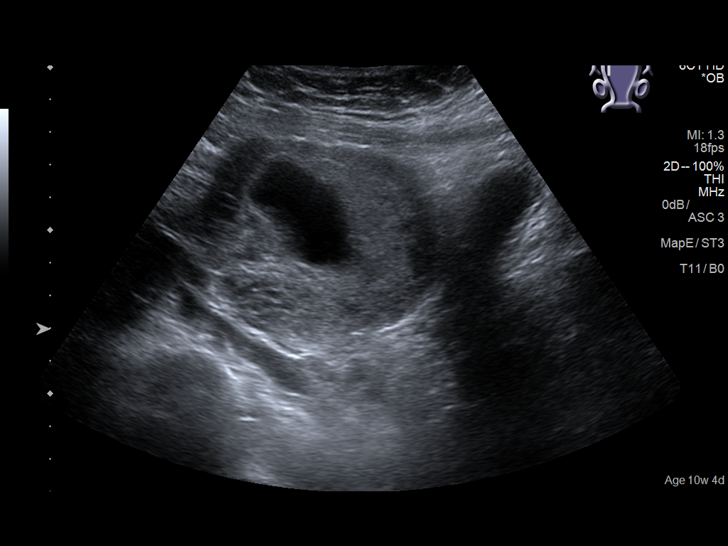
[im 18/52]
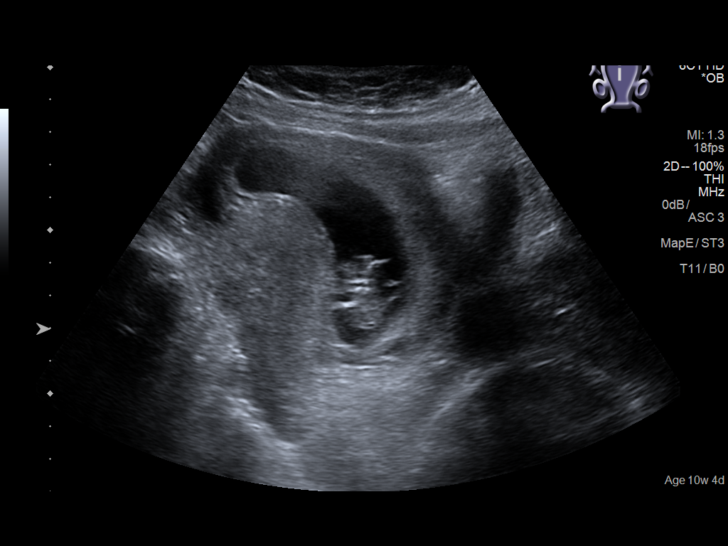
[im 21/52]
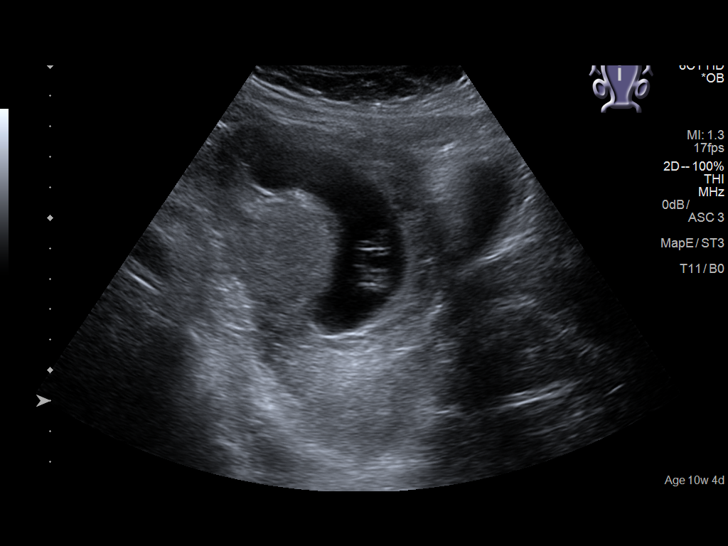
[im 25/52]
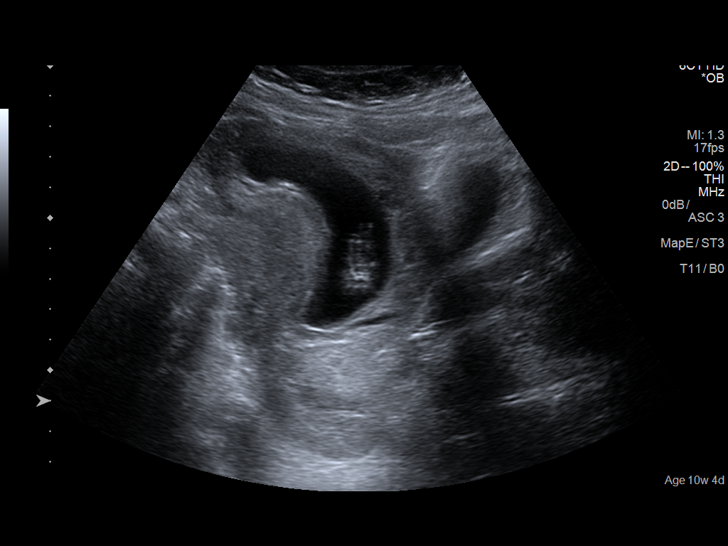
[im 29/52]
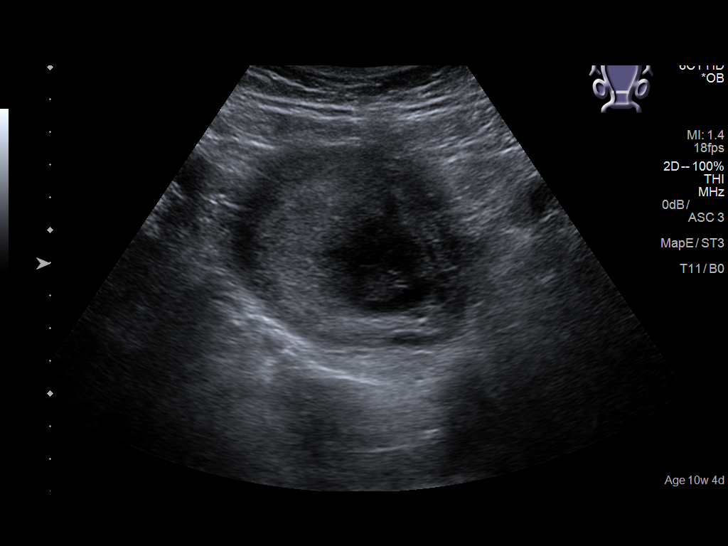
[im 33/52]
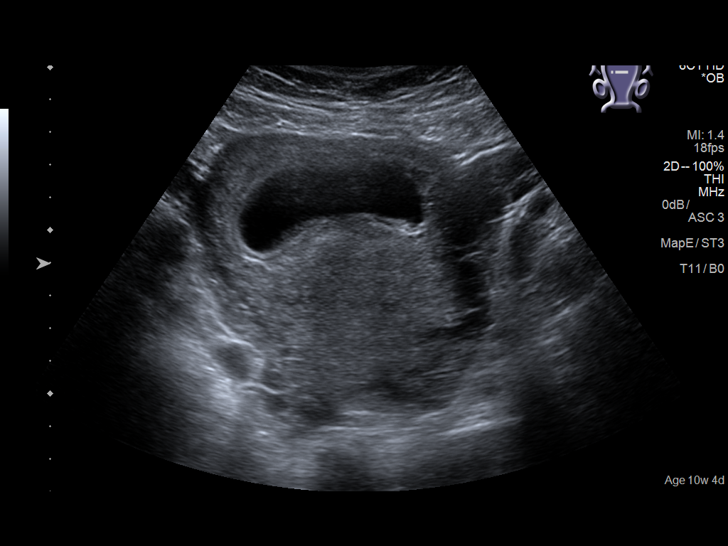
[im 36/52]
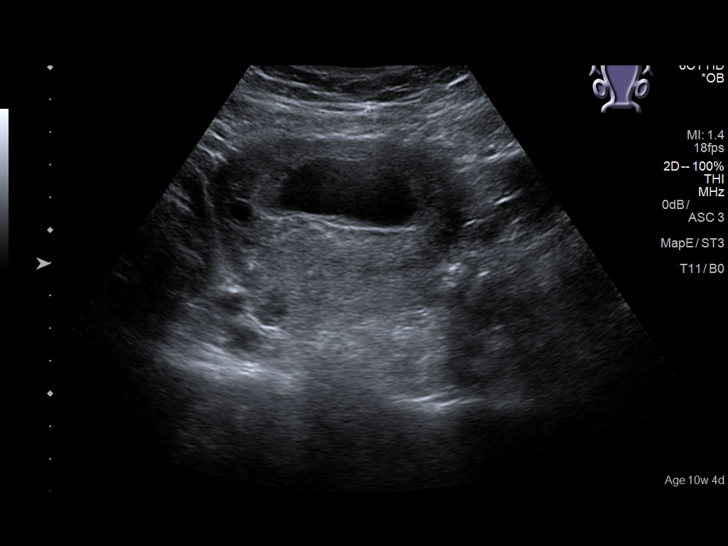
[im 40/52]
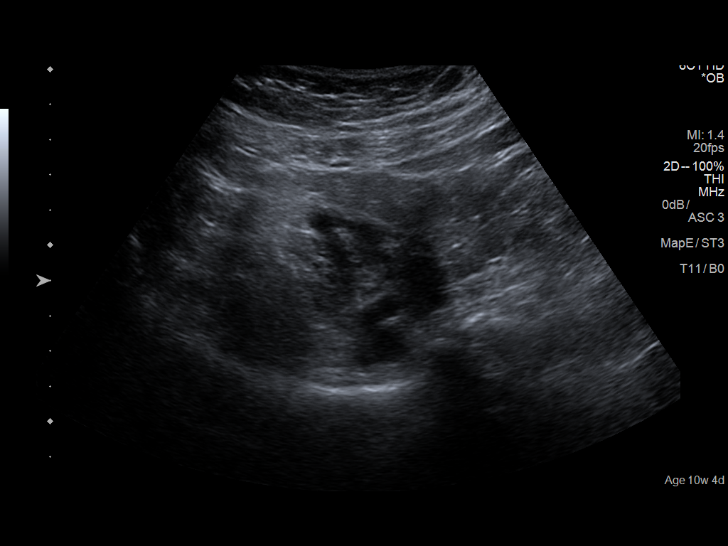
[im 44/52]
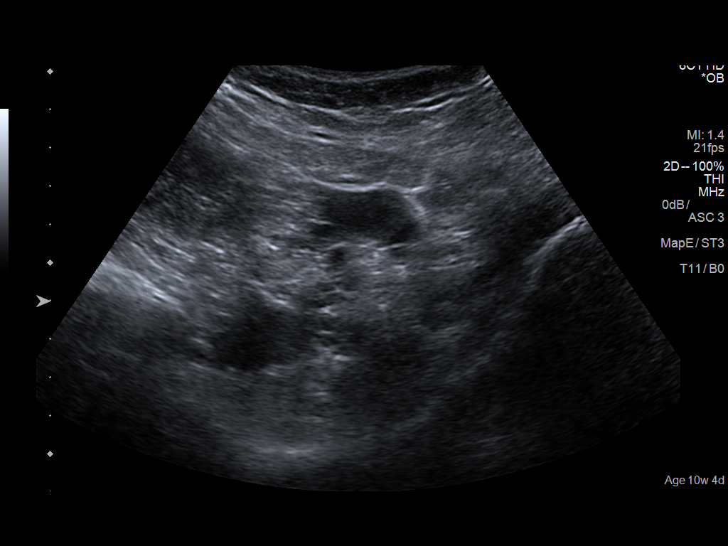
[im 48/52]
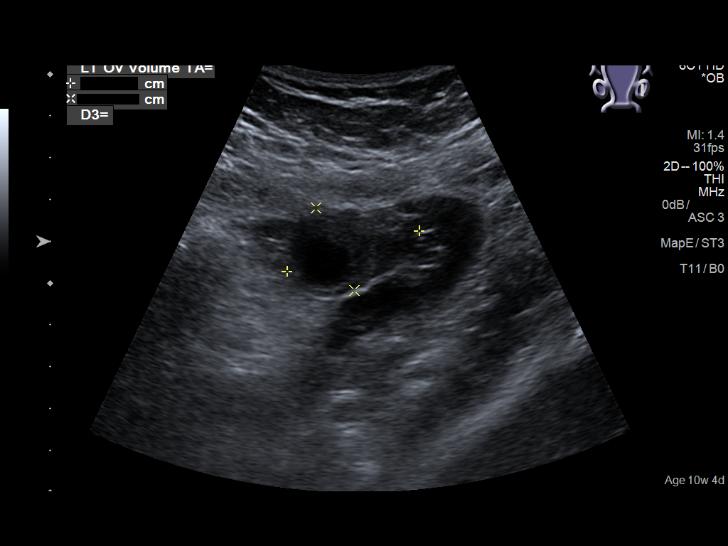
[im 52/52]
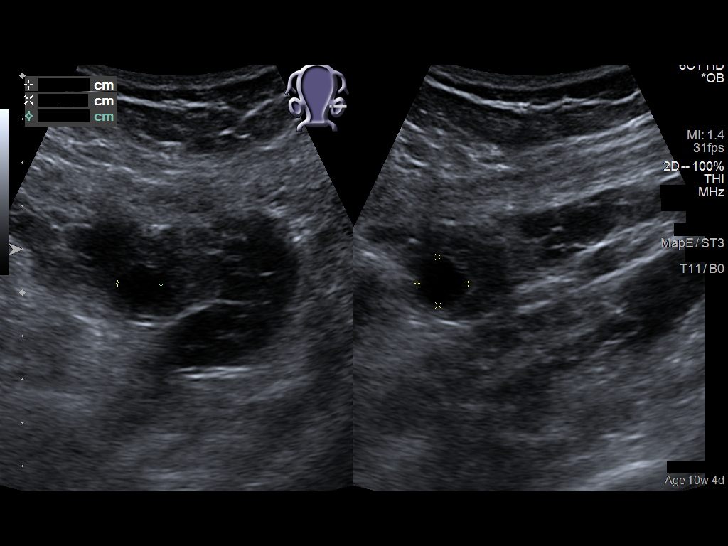

[14 of 28 positions shown; findings below may reference images not displayed]

FINDINGS: Intrauterine gestational sac: Single intrauterine gestational sac.

Yolk sac:  Seen

Embryo:  Present

Cardiac Activity: Detected

Heart Rate: 173 bpm

CRL:   42  mm   11 w 0 d                  US EDC: 06/11/2018

Subchorionic hemorrhage:  None visualized.

Maternal uterus/adnexae: The right ovary is not visualized. There is
a 12 x 11 x 10 mm corpus luteum in the left ovary.
IMPRESSION: Single live intrauterine pregnancy with an estimated gestational age
of 11 weeks, 0 days.

## 2018-07-10 IMAGING — US US OB COMP +14 WK
1 series · 13 of 28 positions shown · non-contrast
Comparison: none

CLINICAL DATA: Current assigned gestational age of 19 weeks 3 days.
Prenatal care. Evaluate fetal anatomy.

EXAM:
OBSTETRICAL ULTRASOUND >14 WKS

[Series 1: us ob comp +14 wk · 0.23mm/px · 13 of 120 slices shown]
[im 5/120]
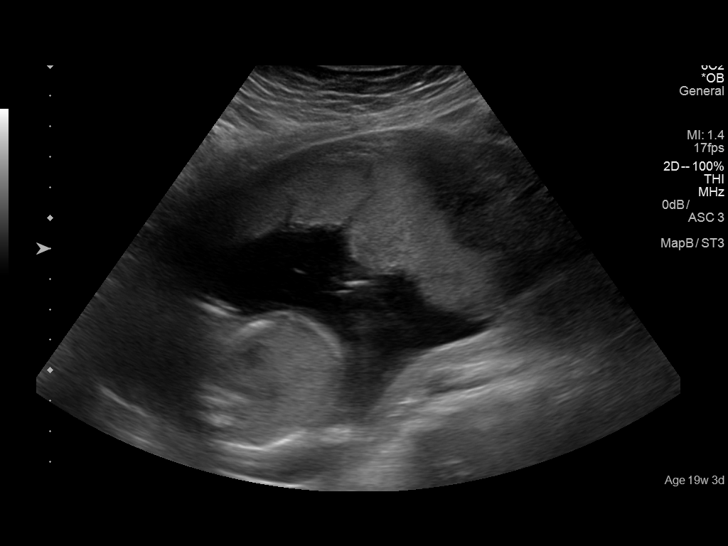
[im 14/120]
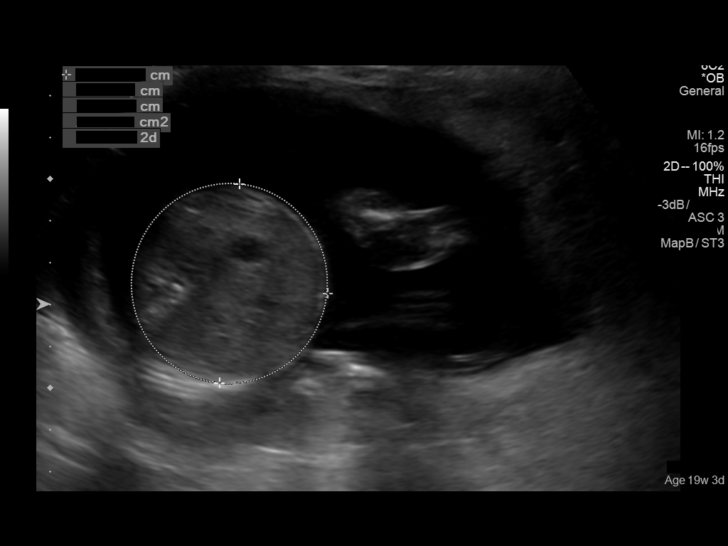
[im 23/120]
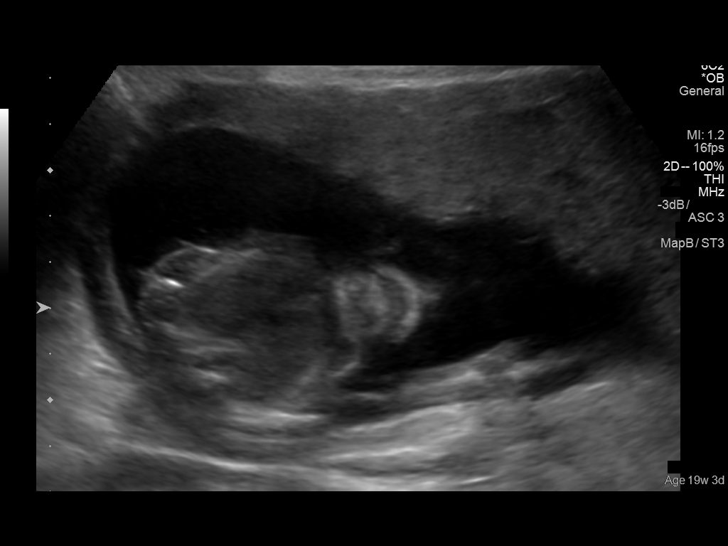
[im 31/120]
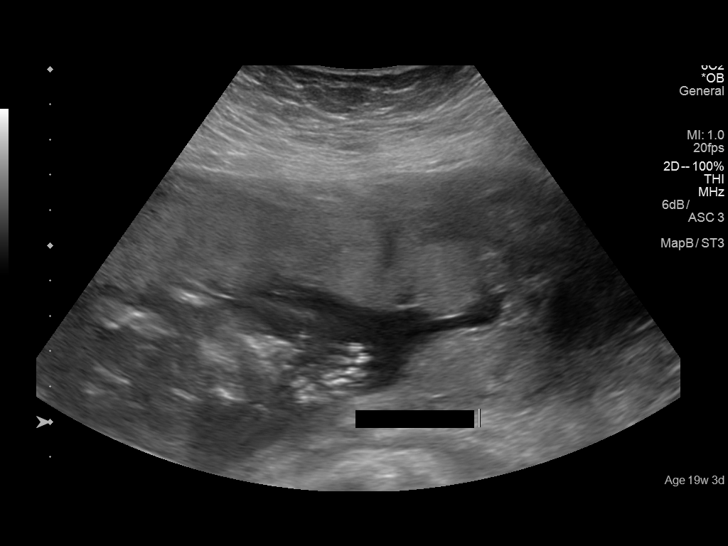
[im 40/120]
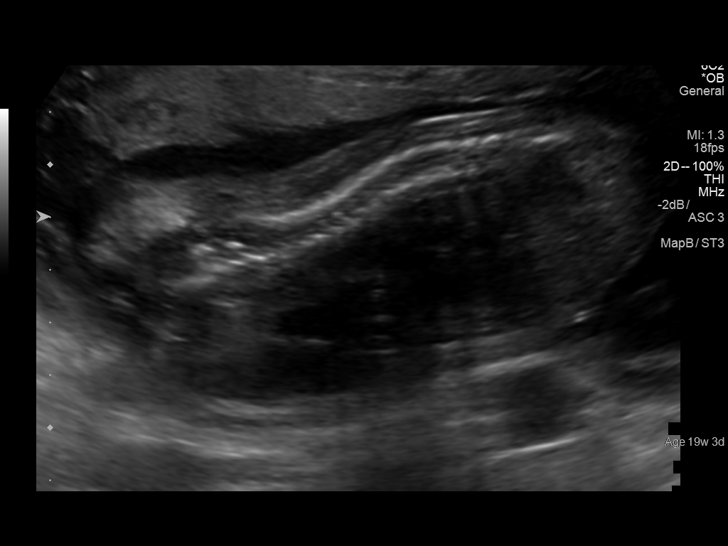
[im 49/120]
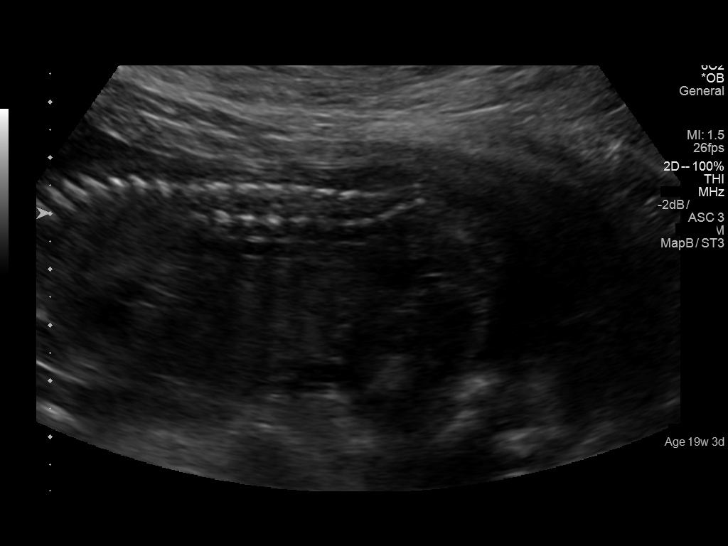
[im 62/120]
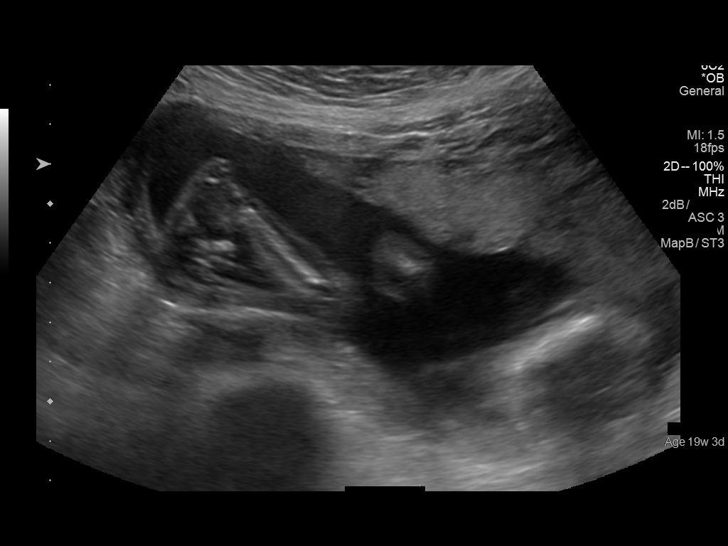
[im 71/120]
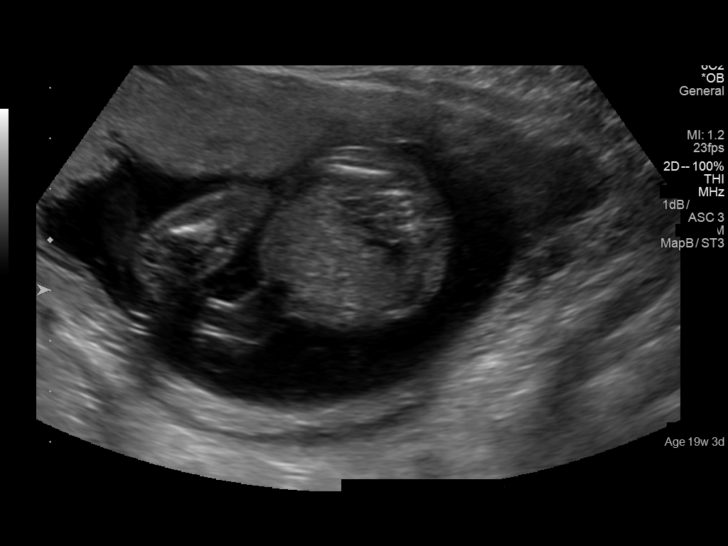
[im 80/120]
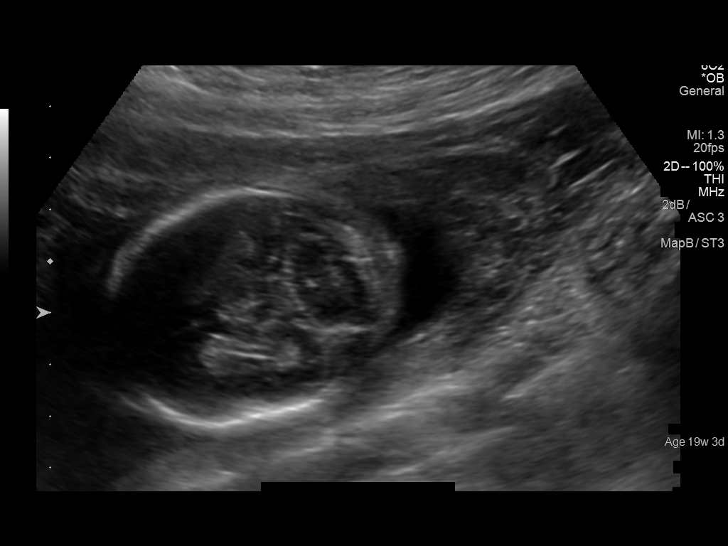
[im 89/120]
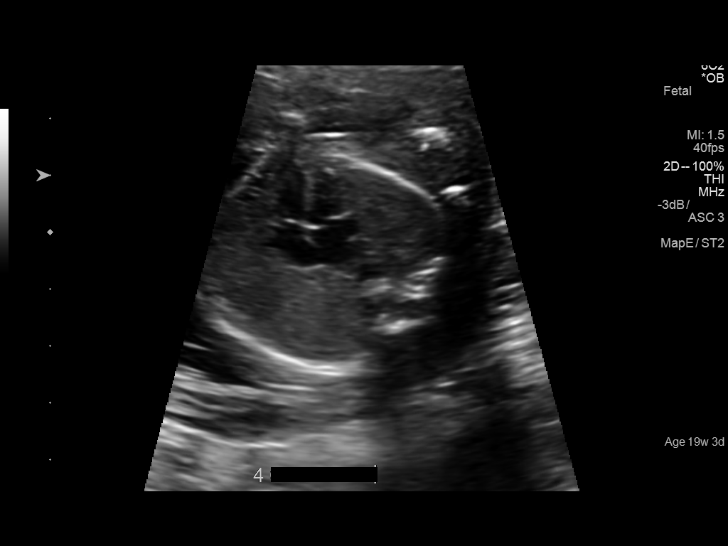
[im 97/120]
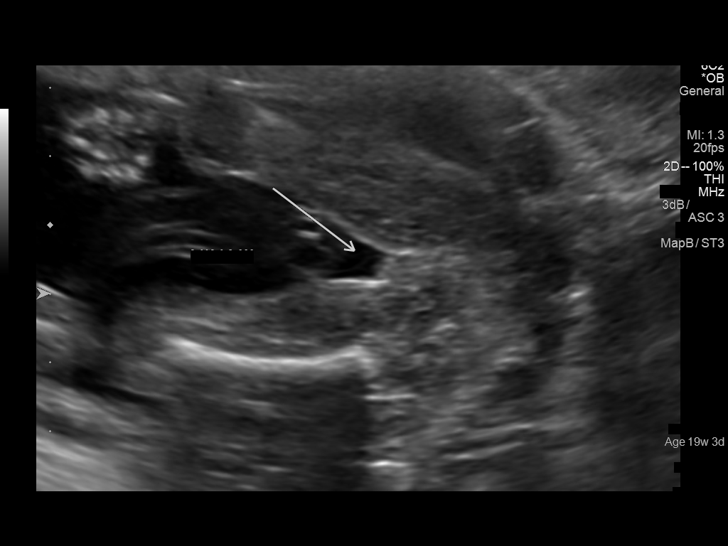
[im 106/120]
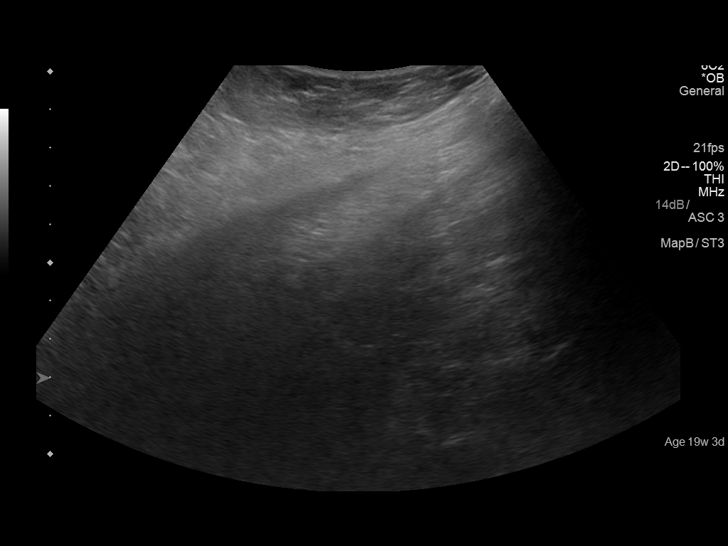
[im 115/120]
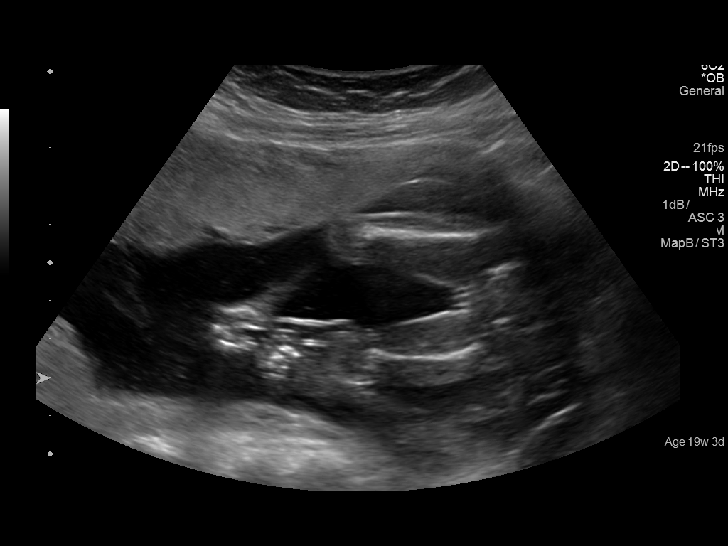

[13 of 28 positions shown; findings below may reference images not displayed]

FINDINGS: Number of Fetuses: 1

Heart Rate:  147 bpm

Movement: Yes

Presentation: Variable

Previa: No

Placental Location: Anterior

Amniotic Fluid (Subjective): Within normal limits

Amniotic Fluid (Objective):

Vertical pocket 3.8cm

FETAL BIOMETRY

BPD:  4.4cm 19w 2d

HC:    16.3cm 19w 1d

AC:   15.2cm 20w 3d

FL:   3.3cm 20w 3d

Current Mean GA: 20w 1d   US EDC: 06/09/2018

FETAL ANATOMY

Lateral Ventricles: Appears normal

Thalami/CSP: Appears normal

Posterior Fossa:  Appears normal

Nuchal Region: Appears normal

Upper Lip: Appears normal

Spine: Appears normal

4 Chamber Heart on Left: Appears normal

LVOT: Appears normal

RVOT: Appears normal

Stomach on Left: Appears normal

3 Vessel Cord: Appears normal

Cord Insertion site: Appears normal

Kidneys: Appears normal

Bladder: Appears normal

Extremities: Appears normal

Maternal Findings:

Cervix: 4.4 cm in length. No mass or other significant abnormality
identified.
IMPRESSION: Assigned gestational age is currently 19 weeks 3 days. Appropriate
fetal growth.

No fetal anomalies seen involving visualized anatomy noted above.

## 2019-10-09 ENCOUNTER — Other Ambulatory Visit: Payer: Self-pay

## 2019-10-09 ENCOUNTER — Emergency Department
Admission: EM | Admit: 2019-10-09 | Discharge: 2019-10-09 | Disposition: A | Discharge status: Home / Self Care | Payer: BC Managed Care – PPO | Attending: Emergency Medicine | Admitting: Emergency Medicine

## 2019-10-09 DIAGNOSIS — R1011 Right upper quadrant pain: Secondary | ICD-10-CM | POA: Insufficient documentation

## 2019-10-09 DIAGNOSIS — R110 Nausea: Secondary | ICD-10-CM | POA: Diagnosis not present

## 2019-10-09 DIAGNOSIS — Z5321 Procedure and treatment not carried out due to patient leaving prior to being seen by health care provider: Secondary | ICD-10-CM | POA: Insufficient documentation

## 2019-10-09 DIAGNOSIS — O99891 Other specified diseases and conditions complicating pregnancy: Secondary | ICD-10-CM | POA: Diagnosis present

## 2019-10-09 DIAGNOSIS — Z3A01 Less than 8 weeks gestation of pregnancy: Secondary | ICD-10-CM | POA: Diagnosis not present

## 2019-10-09 HISTORY — DX: Calculus of gallbladder without cholecystitis without obstruction: K80.20

## 2019-10-09 LAB — CBC
HCT: 40.1 % (ref 36.0–46.0)
Hemoglobin: 13 g/dL (ref 12.0–15.0)
MCH: 29.9 pg (ref 26.0–34.0)
MCHC: 32.4 g/dL (ref 30.0–36.0)
MCV: 92.2 fL (ref 80.0–100.0)
Platelets: 184 10*3/uL (ref 150–400)
RBC: 4.35 MIL/uL (ref 3.87–5.11)
RDW: 13.1 % (ref 11.5–15.5)
WBC: 7.3 10*3/uL (ref 4.0–10.5)
nRBC: 0 % (ref 0.0–0.2)

## 2019-10-09 LAB — COMPREHENSIVE METABOLIC PANEL
ALT: 20 U/L (ref 0–44)
AST: 17 U/L (ref 15–41)
Albumin: 4.1 g/dL (ref 3.5–5.0)
Alkaline Phosphatase: 53 U/L (ref 38–126)
Anion gap: 9 (ref 5–15)
BUN: 13 mg/dL (ref 6–20)
CO2: 24 mmol/L (ref 22–32)
Calcium: 9.2 mg/dL (ref 8.9–10.3)
Chloride: 103 mmol/L (ref 98–111)
Creatinine, Ser: 0.57 mg/dL (ref 0.44–1.00)
GFR calc Af Amer: >60 mL/min (ref >60)
GFR calc non Af Amer: >60 mL/min (ref >60)
Glucose, Bld: 109 mg/dL — ABNORMAL HIGH (ref 70–99)
Potassium: 3.6 mmol/L (ref 3.5–5.1)
Sodium: 136 mmol/L (ref 135–145)
Total Bilirubin: 0.6 mg/dL (ref 0.3–1.2)
Total Protein: 7.4 g/dL (ref 6.5–8.1)

## 2019-10-09 LAB — URINALYSIS, COMPLETE (UACMP) WITH MICROSCOPIC
Bacteria, UA: NONE SEEN
Bilirubin Urine: NEGATIVE
Glucose, UA: NEGATIVE mg/dL
Hgb urine dipstick: NEGATIVE
Ketones, ur: 5 mg/dL — AB
Leukocytes,Ua: NEGATIVE
Nitrite: NEGATIVE
Protein, ur: NEGATIVE mg/dL
Specific Gravity, Urine: 1.012 (ref 1.005–1.030)
pH: 7 (ref 5.0–8.0)

## 2019-10-09 LAB — POCT PREGNANCY, URINE: Preg Test, Ur: POSITIVE — AB

## 2019-10-09 LAB — LIPASE, BLOOD: Lipase: 48 U/L (ref 11–51)

## 2019-10-09 NOTE — ED Triage Notes (Addendum)
Pt to the er for RUQ pain that just started. Pt has a hx of gallstones. Pt reports nausea as well and pain in her back. Pt is also [redacted] weeks pregnant.

## 2019-10-09 NOTE — ED Notes (Signed)
Pt seen walking out the front door; out to speak with her; says she can't wait any longer and she's going to [facility]..unable to convince pt to stay to see provider

## 2019-11-04 ENCOUNTER — Ambulatory Visit: Payer: BC Managed Care – PPO | Admitting: Surgery

## 2019-11-28 ENCOUNTER — Ambulatory Visit: Payer: BC Managed Care – PPO | Admitting: Surgery

## 2020-01-09 DIAGNOSIS — R42 Dizziness and giddiness: Secondary | ICD-10-CM | POA: Insufficient documentation

## 2020-01-13 ENCOUNTER — Other Ambulatory Visit: Payer: Self-pay | Admitting: Acute Care

## 2020-01-13 DIAGNOSIS — R42 Dizziness and giddiness: Secondary | ICD-10-CM

## 2020-01-18 ENCOUNTER — Ambulatory Visit: Payer: BC Managed Care – PPO

## 2020-01-25 ENCOUNTER — Ambulatory Visit
Admission: RE | Admit: 2020-01-25 | Discharge: 2020-01-25 | Disposition: A | Discharge status: Home / Self Care | Payer: BC Managed Care – PPO | Source: Ambulatory Visit | Attending: Acute Care | Admitting: Acute Care

## 2020-01-25 ENCOUNTER — Other Ambulatory Visit: Payer: Self-pay

## 2020-01-25 DIAGNOSIS — R42 Dizziness and giddiness: Secondary | ICD-10-CM | POA: Diagnosis present

## 2020-02-03 DIAGNOSIS — O409XX0 Polyhydramnios, unspecified trimester, not applicable or unspecified: Secondary | ICD-10-CM | POA: Insufficient documentation

## 2020-02-09 ENCOUNTER — Encounter: Payer: Self-pay | Admitting: *Deleted

## 2020-08-09 ENCOUNTER — Ambulatory Visit: Payer: BC Managed Care – PPO | Admitting: Podiatry

## 2023-07-11 DIAGNOSIS — R42 Dizziness and giddiness: Secondary | ICD-10-CM | POA: Diagnosis not present

## 2023-07-11 DIAGNOSIS — R5383 Other fatigue: Secondary | ICD-10-CM | POA: Diagnosis not present

## 2023-07-11 DIAGNOSIS — N390 Urinary tract infection, site not specified: Secondary | ICD-10-CM | POA: Diagnosis not present

## 2023-07-11 DIAGNOSIS — R8290 Unspecified abnormal findings in urine: Secondary | ICD-10-CM | POA: Diagnosis not present

## 2023-07-11 DIAGNOSIS — R519 Headache, unspecified: Secondary | ICD-10-CM | POA: Diagnosis not present

## 2023-07-11 DIAGNOSIS — K219 Gastro-esophageal reflux disease without esophagitis: Secondary | ICD-10-CM | POA: Diagnosis not present

## 2023-07-11 DIAGNOSIS — R1084 Generalized abdominal pain: Secondary | ICD-10-CM | POA: Diagnosis not present

## 2023-07-11 DIAGNOSIS — R7309 Other abnormal glucose: Secondary | ICD-10-CM | POA: Diagnosis not present

## 2023-07-30 DIAGNOSIS — Z114 Encounter for screening for human immunodeficiency virus [HIV]: Secondary | ICD-10-CM | POA: Diagnosis not present

## 2023-07-30 DIAGNOSIS — Z113 Encounter for screening for infections with a predominantly sexual mode of transmission: Secondary | ICD-10-CM | POA: Diagnosis not present

## 2023-07-30 DIAGNOSIS — R7303 Prediabetes: Secondary | ICD-10-CM | POA: Diagnosis not present

## 2023-10-05 DIAGNOSIS — R7303 Prediabetes: Secondary | ICD-10-CM | POA: Diagnosis not present

## 2023-10-05 DIAGNOSIS — R5383 Other fatigue: Secondary | ICD-10-CM | POA: Diagnosis not present

## 2023-10-05 DIAGNOSIS — Z0000 Encounter for general adult medical examination without abnormal findings: Secondary | ICD-10-CM | POA: Diagnosis not present

## 2023-10-05 DIAGNOSIS — R519 Headache, unspecified: Secondary | ICD-10-CM | POA: Diagnosis not present

## 2023-10-05 DIAGNOSIS — F32A Depression, unspecified: Secondary | ICD-10-CM | POA: Diagnosis not present

## 2023-11-20 DIAGNOSIS — R7303 Prediabetes: Secondary | ICD-10-CM | POA: Diagnosis not present

## 2023-11-20 DIAGNOSIS — Z0000 Encounter for general adult medical examination without abnormal findings: Secondary | ICD-10-CM | POA: Diagnosis not present

## 2023-12-16 DIAGNOSIS — Z124 Encounter for screening for malignant neoplasm of cervix: Secondary | ICD-10-CM | POA: Diagnosis not present

## 2023-12-16 DIAGNOSIS — E6609 Other obesity due to excess calories: Secondary | ICD-10-CM | POA: Diagnosis not present

## 2023-12-16 DIAGNOSIS — E66812 Obesity, class 2: Secondary | ICD-10-CM | POA: Diagnosis not present

## 2023-12-16 DIAGNOSIS — Z6836 Body mass index (BMI) 36.0-36.9, adult: Secondary | ICD-10-CM | POA: Diagnosis not present

## 2023-12-16 DIAGNOSIS — R7303 Prediabetes: Secondary | ICD-10-CM | POA: Diagnosis not present

## 2023-12-16 DIAGNOSIS — F32A Depression, unspecified: Secondary | ICD-10-CM | POA: Diagnosis not present

## 2024-01-20 DIAGNOSIS — E66812 Obesity, class 2: Secondary | ICD-10-CM | POA: Diagnosis not present

## 2024-01-20 DIAGNOSIS — R7303 Prediabetes: Secondary | ICD-10-CM | POA: Diagnosis not present

## 2024-01-20 DIAGNOSIS — E6609 Other obesity due to excess calories: Secondary | ICD-10-CM | POA: Diagnosis not present

## 2024-01-20 DIAGNOSIS — Z713 Dietary counseling and surveillance: Secondary | ICD-10-CM | POA: Diagnosis not present

## 2024-01-20 DIAGNOSIS — K219 Gastro-esophageal reflux disease without esophagitis: Secondary | ICD-10-CM | POA: Diagnosis not present

## 2024-01-20 DIAGNOSIS — F32A Depression, unspecified: Secondary | ICD-10-CM | POA: Diagnosis not present

## 2024-01-20 DIAGNOSIS — Z6836 Body mass index (BMI) 36.0-36.9, adult: Secondary | ICD-10-CM | POA: Diagnosis not present

## 2024-04-21 DIAGNOSIS — R7303 Prediabetes: Secondary | ICD-10-CM | POA: Diagnosis not present

## 2024-04-21 DIAGNOSIS — Z713 Dietary counseling and surveillance: Secondary | ICD-10-CM | POA: Diagnosis not present

## 2024-04-21 DIAGNOSIS — G5622 Lesion of ulnar nerve, left upper limb: Secondary | ICD-10-CM | POA: Diagnosis not present

## 2024-04-21 DIAGNOSIS — E66812 Obesity, class 2: Secondary | ICD-10-CM | POA: Diagnosis not present

## 2024-04-21 DIAGNOSIS — Z6836 Body mass index (BMI) 36.0-36.9, adult: Secondary | ICD-10-CM | POA: Diagnosis not present

## 2024-04-21 DIAGNOSIS — E6609 Other obesity due to excess calories: Secondary | ICD-10-CM | POA: Diagnosis not present

## 2024-06-23 DIAGNOSIS — N764 Abscess of vulva: Secondary | ICD-10-CM | POA: Diagnosis not present
